# Patient Record
Sex: Male | Born: 1935 | Race: White | Hispanic: No | Marital: Single | Smoking: Never smoker
Health system: Southern US, Community
[De-identification: ages and names within clinical notes are randomized; demographics above are authoritative.]

## PROBLEM LIST (undated history)

## (undated) DIAGNOSIS — E785 Hyperlipidemia, unspecified: Secondary | ICD-10-CM

## (undated) DIAGNOSIS — I10 Essential (primary) hypertension: Secondary | ICD-10-CM

## (undated) DIAGNOSIS — C61 Malignant neoplasm of prostate: Secondary | ICD-10-CM

## (undated) HISTORY — PX: TONSILECTOMY, ADENOIDECTOMY, BILATERAL MYRINGOTOMY AND TUBES: SHX2538

## (undated) HISTORY — PX: HERNIA REPAIR: SHX51

## (undated) HISTORY — DX: Essential (primary) hypertension: I10

## (undated) HISTORY — DX: Malignant neoplasm of prostate: C61

## (undated) HISTORY — DX: Hyperlipidemia, unspecified: E78.5

---

## 2004-10-17 ENCOUNTER — Ambulatory Visit: Payer: Self-pay | Admitting: Radiation Oncology

## 2004-10-24 ENCOUNTER — Ambulatory Visit: Payer: Self-pay | Admitting: Radiation Oncology

## 2005-04-17 ENCOUNTER — Ambulatory Visit: Payer: Self-pay | Admitting: Radiation Oncology

## 2005-04-23 ENCOUNTER — Ambulatory Visit: Payer: Self-pay | Admitting: Radiation Oncology

## 2006-03-18 ENCOUNTER — Ambulatory Visit: Payer: Self-pay | Admitting: Gastroenterology

## 2006-04-03 ENCOUNTER — Ambulatory Visit: Payer: Self-pay | Admitting: Urology

## 2006-04-24 ENCOUNTER — Ambulatory Visit: Payer: Self-pay | Admitting: Radiation Oncology

## 2006-05-24 ENCOUNTER — Ambulatory Visit: Payer: Self-pay | Admitting: Radiation Oncology

## 2007-03-24 ENCOUNTER — Ambulatory Visit: Payer: Self-pay | Admitting: Radiation Oncology

## 2007-04-23 ENCOUNTER — Ambulatory Visit: Payer: Self-pay | Admitting: Radiation Oncology

## 2007-04-24 ENCOUNTER — Ambulatory Visit: Payer: Self-pay | Admitting: Radiation Oncology

## 2007-09-24 ENCOUNTER — Ambulatory Visit: Payer: Self-pay | Admitting: Radiation Oncology

## 2007-10-25 ENCOUNTER — Ambulatory Visit: Payer: Self-pay | Admitting: Radiation Oncology

## 2008-02-23 ENCOUNTER — Ambulatory Visit: Payer: Self-pay | Admitting: Internal Medicine

## 2008-02-24 ENCOUNTER — Ambulatory Visit: Payer: Self-pay | Admitting: Internal Medicine

## 2008-03-23 ENCOUNTER — Ambulatory Visit: Payer: Self-pay | Admitting: Radiation Oncology

## 2008-04-07 ENCOUNTER — Ambulatory Visit: Payer: Self-pay | Admitting: Radiation Oncology

## 2008-04-23 ENCOUNTER — Ambulatory Visit: Payer: Self-pay | Admitting: Radiation Oncology

## 2008-06-29 ENCOUNTER — Other Ambulatory Visit: Payer: Self-pay

## 2008-06-29 ENCOUNTER — Ambulatory Visit: Payer: Self-pay | Admitting: Surgery

## 2008-07-06 ENCOUNTER — Ambulatory Visit: Payer: Self-pay | Admitting: Surgery

## 2008-08-30 ENCOUNTER — Ambulatory Visit: Payer: Self-pay | Admitting: Internal Medicine

## 2009-02-27 ENCOUNTER — Ambulatory Visit: Payer: Self-pay | Admitting: Internal Medicine

## 2009-07-18 ENCOUNTER — Ambulatory Visit: Payer: Self-pay | Admitting: Unknown Physician Specialty

## 2010-10-17 ENCOUNTER — Ambulatory Visit: Payer: Self-pay | Admitting: Urology

## 2013-10-01 ENCOUNTER — Emergency Department: Payer: Self-pay | Admitting: Emergency Medicine

## 2013-10-02 ENCOUNTER — Emergency Department: Payer: Self-pay | Admitting: Emergency Medicine

## 2013-10-02 LAB — CBC WITH DIFFERENTIAL/PLATELET
BASOS PCT: 0.2 %
Basophil #: 0 10*3/uL (ref 0.0–0.1)
EOS PCT: 0.1 %
Eosinophil #: 0 10*3/uL (ref 0.0–0.7)
HCT: 34 % — ABNORMAL LOW (ref 40.0–52.0)
HGB: 11.4 g/dL — AB (ref 13.0–18.0)
Lymphocyte #: 0.9 10*3/uL — ABNORMAL LOW (ref 1.0–3.6)
Lymphocyte %: 8.4 %
MCH: 33.3 pg (ref 26.0–34.0)
MCHC: 33.4 g/dL (ref 32.0–36.0)
MCV: 100 fL (ref 80–100)
Monocyte #: 1.3 x10 3/mm — ABNORMAL HIGH (ref 0.2–1.0)
Monocyte %: 11.4 %
NEUTROS ABS: 9.1 10*3/uL — AB (ref 1.4–6.5)
NEUTROS PCT: 79.9 %
Platelet: 170 10*3/uL (ref 150–440)
RBC: 3.41 10*6/uL — ABNORMAL LOW (ref 4.40–5.90)
RDW: 13.8 % (ref 11.5–14.5)
WBC: 11.3 10*3/uL — ABNORMAL HIGH (ref 3.8–10.6)

## 2013-10-02 LAB — BASIC METABOLIC PANEL
ANION GAP: 6 — AB (ref 7–16)
BUN: 35 mg/dL — ABNORMAL HIGH (ref 7–18)
CO2: 28 mmol/L (ref 21–32)
Calcium, Total: 9.1 mg/dL (ref 8.5–10.1)
Chloride: 105 mmol/L (ref 98–107)
Creatinine: 1.66 mg/dL — ABNORMAL HIGH (ref 0.60–1.30)
EGFR (Non-African Amer.): 38 — ABNORMAL LOW
GFR CALC AF AMER: 44 — AB
Glucose: 174 mg/dL — ABNORMAL HIGH (ref 65–99)
OSMOLALITY: 290 (ref 275–301)
POTASSIUM: 4 mmol/L (ref 3.5–5.1)
Sodium: 139 mmol/L (ref 136–145)

## 2013-10-02 LAB — URINALYSIS, COMPLETE
BILIRUBIN, UR: NEGATIVE
BLOOD: NEGATIVE
Glucose,UR: 50 mg/dL (ref 0–75)
Hyaline Cast: 59
LEUKOCYTE ESTERASE: NEGATIVE
Nitrite: NEGATIVE
Ph: 5 (ref 4.5–8.0)
Protein: 30
RBC,UR: <1 /HPF (ref 0–5)
Specific Gravity: 1.023 (ref 1.003–1.030)
Squamous Epithelial: 1
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
## 2014-09-26 DIAGNOSIS — D649 Anemia, unspecified: Secondary | ICD-10-CM | POA: Diagnosis not present

## 2014-09-26 DIAGNOSIS — I10 Essential (primary) hypertension: Secondary | ICD-10-CM | POA: Diagnosis not present

## 2014-09-26 DIAGNOSIS — E785 Hyperlipidemia, unspecified: Secondary | ICD-10-CM | POA: Diagnosis not present

## 2014-09-26 DIAGNOSIS — D72819 Decreased white blood cell count, unspecified: Secondary | ICD-10-CM | POA: Diagnosis not present

## 2014-09-29 DIAGNOSIS — B0230 Zoster ocular disease, unspecified: Secondary | ICD-10-CM | POA: Diagnosis not present

## 2014-10-13 DIAGNOSIS — B0230 Zoster ocular disease, unspecified: Secondary | ICD-10-CM | POA: Diagnosis not present

## 2014-10-27 DIAGNOSIS — B0230 Zoster ocular disease, unspecified: Secondary | ICD-10-CM | POA: Diagnosis not present

## 2014-11-08 DIAGNOSIS — S022XXA Fracture of nasal bones, initial encounter for closed fracture: Secondary | ICD-10-CM | POA: Diagnosis not present

## 2014-11-08 DIAGNOSIS — S0992XA Unspecified injury of nose, initial encounter: Secondary | ICD-10-CM | POA: Diagnosis not present

## 2014-11-08 DIAGNOSIS — Y92099 Unspecified place in other non-institutional residence as the place of occurrence of the external cause: Secondary | ICD-10-CM | POA: Diagnosis not present

## 2014-11-08 DIAGNOSIS — T148 Other injury of unspecified body region: Secondary | ICD-10-CM | POA: Diagnosis not present

## 2014-11-08 DIAGNOSIS — W19XXXA Unspecified fall, initial encounter: Secondary | ICD-10-CM | POA: Diagnosis not present

## 2014-11-10 DIAGNOSIS — B0230 Zoster ocular disease, unspecified: Secondary | ICD-10-CM | POA: Diagnosis not present

## 2014-12-08 DIAGNOSIS — I4891 Unspecified atrial fibrillation: Secondary | ICD-10-CM | POA: Insufficient documentation

## 2014-12-09 DIAGNOSIS — B0230 Zoster ocular disease, unspecified: Secondary | ICD-10-CM | POA: Diagnosis not present

## 2014-12-12 DIAGNOSIS — R351 Nocturia: Secondary | ICD-10-CM | POA: Diagnosis not present

## 2014-12-12 DIAGNOSIS — R350 Frequency of micturition: Secondary | ICD-10-CM | POA: Diagnosis not present

## 2014-12-12 DIAGNOSIS — Z8546 Personal history of malignant neoplasm of prostate: Secondary | ICD-10-CM | POA: Diagnosis not present

## 2014-12-12 DIAGNOSIS — N401 Enlarged prostate with lower urinary tract symptoms: Secondary | ICD-10-CM | POA: Diagnosis not present

## 2015-01-06 DIAGNOSIS — B0230 Zoster ocular disease, unspecified: Secondary | ICD-10-CM | POA: Diagnosis not present

## 2015-02-03 DIAGNOSIS — B0230 Zoster ocular disease, unspecified: Secondary | ICD-10-CM | POA: Diagnosis not present

## 2015-02-17 DIAGNOSIS — B0230 Zoster ocular disease, unspecified: Secondary | ICD-10-CM | POA: Diagnosis not present

## 2015-03-07 DIAGNOSIS — B0232 Zoster iridocyclitis: Secondary | ICD-10-CM | POA: Diagnosis not present

## 2015-03-28 DIAGNOSIS — I10 Essential (primary) hypertension: Secondary | ICD-10-CM | POA: Diagnosis not present

## 2015-03-28 DIAGNOSIS — D649 Anemia, unspecified: Secondary | ICD-10-CM | POA: Diagnosis not present

## 2015-03-28 DIAGNOSIS — E785 Hyperlipidemia, unspecified: Secondary | ICD-10-CM | POA: Diagnosis not present

## 2015-03-28 DIAGNOSIS — I480 Paroxysmal atrial fibrillation: Secondary | ICD-10-CM | POA: Diagnosis not present

## 2015-04-04 DIAGNOSIS — D649 Anemia, unspecified: Secondary | ICD-10-CM | POA: Diagnosis not present

## 2015-04-04 DIAGNOSIS — E784 Other hyperlipidemia: Secondary | ICD-10-CM | POA: Diagnosis not present

## 2015-04-04 DIAGNOSIS — I10 Essential (primary) hypertension: Secondary | ICD-10-CM | POA: Diagnosis not present

## 2015-04-04 DIAGNOSIS — I480 Paroxysmal atrial fibrillation: Secondary | ICD-10-CM | POA: Diagnosis not present

## 2015-04-13 DIAGNOSIS — D649 Anemia, unspecified: Secondary | ICD-10-CM | POA: Diagnosis not present

## 2015-05-04 DIAGNOSIS — I480 Paroxysmal atrial fibrillation: Secondary | ICD-10-CM | POA: Diagnosis not present

## 2015-05-04 DIAGNOSIS — E784 Other hyperlipidemia: Secondary | ICD-10-CM | POA: Diagnosis not present

## 2015-05-04 DIAGNOSIS — I10 Essential (primary) hypertension: Secondary | ICD-10-CM | POA: Diagnosis not present

## 2015-05-04 DIAGNOSIS — D649 Anemia, unspecified: Secondary | ICD-10-CM | POA: Diagnosis not present

## 2015-05-17 DIAGNOSIS — B0232 Zoster iridocyclitis: Secondary | ICD-10-CM | POA: Diagnosis not present

## 2015-09-28 DIAGNOSIS — I10 Essential (primary) hypertension: Secondary | ICD-10-CM | POA: Diagnosis not present

## 2015-09-28 DIAGNOSIS — D649 Anemia, unspecified: Secondary | ICD-10-CM | POA: Diagnosis not present

## 2015-10-05 DIAGNOSIS — D72818 Other decreased white blood cell count: Secondary | ICD-10-CM | POA: Diagnosis not present

## 2015-10-05 DIAGNOSIS — I480 Paroxysmal atrial fibrillation: Secondary | ICD-10-CM | POA: Diagnosis not present

## 2015-10-05 DIAGNOSIS — E784 Other hyperlipidemia: Secondary | ICD-10-CM | POA: Diagnosis not present

## 2015-10-05 DIAGNOSIS — D6489 Other specified anemias: Secondary | ICD-10-CM | POA: Diagnosis not present

## 2015-10-05 DIAGNOSIS — I10 Essential (primary) hypertension: Secondary | ICD-10-CM | POA: Diagnosis not present

## 2015-11-14 DIAGNOSIS — H2513 Age-related nuclear cataract, bilateral: Secondary | ICD-10-CM | POA: Diagnosis not present

## 2015-12-12 DIAGNOSIS — Z8546 Personal history of malignant neoplasm of prostate: Secondary | ICD-10-CM | POA: Diagnosis not present

## 2015-12-12 DIAGNOSIS — N401 Enlarged prostate with lower urinary tract symptoms: Secondary | ICD-10-CM | POA: Diagnosis not present

## 2016-03-29 DIAGNOSIS — I10 Essential (primary) hypertension: Secondary | ICD-10-CM | POA: Diagnosis not present

## 2016-03-29 DIAGNOSIS — D6489 Other specified anemias: Secondary | ICD-10-CM | POA: Diagnosis not present

## 2016-03-29 DIAGNOSIS — I480 Paroxysmal atrial fibrillation: Secondary | ICD-10-CM | POA: Diagnosis not present

## 2016-04-05 DIAGNOSIS — H3530 Unspecified macular degeneration: Secondary | ICD-10-CM | POA: Diagnosis not present

## 2016-04-05 DIAGNOSIS — D72818 Other decreased white blood cell count: Secondary | ICD-10-CM | POA: Diagnosis not present

## 2016-04-05 DIAGNOSIS — Z8546 Personal history of malignant neoplasm of prostate: Secondary | ICD-10-CM | POA: Diagnosis not present

## 2016-04-05 DIAGNOSIS — E784 Other hyperlipidemia: Secondary | ICD-10-CM | POA: Diagnosis not present

## 2016-04-05 DIAGNOSIS — I480 Paroxysmal atrial fibrillation: Secondary | ICD-10-CM | POA: Diagnosis not present

## 2016-04-05 DIAGNOSIS — D6489 Other specified anemias: Secondary | ICD-10-CM | POA: Diagnosis not present

## 2016-04-05 DIAGNOSIS — I10 Essential (primary) hypertension: Secondary | ICD-10-CM | POA: Diagnosis not present

## 2016-10-02 DIAGNOSIS — D6489 Other specified anemias: Secondary | ICD-10-CM | POA: Diagnosis not present

## 2016-10-02 DIAGNOSIS — I10 Essential (primary) hypertension: Secondary | ICD-10-CM | POA: Diagnosis not present

## 2016-10-02 DIAGNOSIS — I480 Paroxysmal atrial fibrillation: Secondary | ICD-10-CM | POA: Diagnosis not present

## 2016-11-13 DIAGNOSIS — I10 Essential (primary) hypertension: Secondary | ICD-10-CM | POA: Diagnosis not present

## 2016-11-13 DIAGNOSIS — D72818 Other decreased white blood cell count: Secondary | ICD-10-CM | POA: Diagnosis not present

## 2016-11-13 DIAGNOSIS — I480 Paroxysmal atrial fibrillation: Secondary | ICD-10-CM | POA: Diagnosis not present

## 2016-11-13 DIAGNOSIS — E784 Other hyperlipidemia: Secondary | ICD-10-CM | POA: Diagnosis not present

## 2016-11-13 DIAGNOSIS — Z8546 Personal history of malignant neoplasm of prostate: Secondary | ICD-10-CM | POA: Diagnosis not present

## 2016-11-13 DIAGNOSIS — R251 Tremor, unspecified: Secondary | ICD-10-CM | POA: Diagnosis not present

## 2016-11-13 DIAGNOSIS — D649 Anemia, unspecified: Secondary | ICD-10-CM | POA: Diagnosis not present

## 2016-12-17 DIAGNOSIS — Z8546 Personal history of malignant neoplasm of prostate: Secondary | ICD-10-CM | POA: Diagnosis not present

## 2016-12-18 DIAGNOSIS — Z8546 Personal history of malignant neoplasm of prostate: Secondary | ICD-10-CM | POA: Diagnosis not present

## 2017-05-05 DIAGNOSIS — I480 Paroxysmal atrial fibrillation: Secondary | ICD-10-CM | POA: Diagnosis not present

## 2017-05-05 DIAGNOSIS — D649 Anemia, unspecified: Secondary | ICD-10-CM | POA: Diagnosis not present

## 2017-05-05 DIAGNOSIS — I10 Essential (primary) hypertension: Secondary | ICD-10-CM | POA: Diagnosis not present

## 2017-05-12 DIAGNOSIS — D649 Anemia, unspecified: Secondary | ICD-10-CM | POA: Diagnosis not present

## 2017-05-12 DIAGNOSIS — E784 Other hyperlipidemia: Secondary | ICD-10-CM | POA: Diagnosis not present

## 2017-05-12 DIAGNOSIS — D72818 Other decreased white blood cell count: Secondary | ICD-10-CM | POA: Diagnosis not present

## 2017-05-12 DIAGNOSIS — I10 Essential (primary) hypertension: Secondary | ICD-10-CM | POA: Diagnosis not present

## 2017-05-12 DIAGNOSIS — Z0000 Encounter for general adult medical examination without abnormal findings: Secondary | ICD-10-CM | POA: Diagnosis not present

## 2017-05-12 DIAGNOSIS — I480 Paroxysmal atrial fibrillation: Secondary | ICD-10-CM | POA: Diagnosis not present

## 2017-05-12 DIAGNOSIS — Z8546 Personal history of malignant neoplasm of prostate: Secondary | ICD-10-CM | POA: Diagnosis not present

## 2017-11-07 DIAGNOSIS — E7849 Other hyperlipidemia: Secondary | ICD-10-CM | POA: Diagnosis not present

## 2017-11-07 DIAGNOSIS — I10 Essential (primary) hypertension: Secondary | ICD-10-CM | POA: Diagnosis not present

## 2017-11-07 DIAGNOSIS — I480 Paroxysmal atrial fibrillation: Secondary | ICD-10-CM | POA: Diagnosis not present

## 2017-11-07 DIAGNOSIS — D649 Anemia, unspecified: Secondary | ICD-10-CM | POA: Diagnosis not present

## 2017-11-17 DIAGNOSIS — R251 Tremor, unspecified: Secondary | ICD-10-CM | POA: Diagnosis not present

## 2017-11-17 DIAGNOSIS — I10 Essential (primary) hypertension: Secondary | ICD-10-CM | POA: Diagnosis not present

## 2017-11-17 DIAGNOSIS — E7849 Other hyperlipidemia: Secondary | ICD-10-CM | POA: Diagnosis not present

## 2017-11-17 DIAGNOSIS — D692 Other nonthrombocytopenic purpura: Secondary | ICD-10-CM | POA: Diagnosis not present

## 2017-11-17 DIAGNOSIS — D72818 Other decreased white blood cell count: Secondary | ICD-10-CM | POA: Diagnosis not present

## 2017-11-17 DIAGNOSIS — I480 Paroxysmal atrial fibrillation: Secondary | ICD-10-CM | POA: Diagnosis not present

## 2017-11-17 DIAGNOSIS — D649 Anemia, unspecified: Secondary | ICD-10-CM | POA: Diagnosis not present

## 2017-11-17 DIAGNOSIS — R011 Cardiac murmur, unspecified: Secondary | ICD-10-CM | POA: Diagnosis not present

## 2017-11-17 DIAGNOSIS — Z8546 Personal history of malignant neoplasm of prostate: Secondary | ICD-10-CM | POA: Diagnosis not present

## 2017-11-21 DIAGNOSIS — R011 Cardiac murmur, unspecified: Secondary | ICD-10-CM | POA: Diagnosis not present

## 2017-11-24 DIAGNOSIS — I340 Nonrheumatic mitral (valve) insufficiency: Secondary | ICD-10-CM | POA: Insufficient documentation

## 2017-11-25 ENCOUNTER — Ambulatory Visit: Payer: Medicare HMO | Admitting: Urology

## 2017-11-25 ENCOUNTER — Encounter: Payer: Self-pay | Admitting: Urology

## 2017-11-25 VITALS — BP 134/71 | HR 70 | Ht 73.0 in | Wt 156.3 lb

## 2017-11-25 DIAGNOSIS — R351 Nocturia: Secondary | ICD-10-CM | POA: Diagnosis not present

## 2017-11-25 DIAGNOSIS — Z8546 Personal history of malignant neoplasm of prostate: Secondary | ICD-10-CM

## 2017-11-25 DIAGNOSIS — N138 Other obstructive and reflux uropathy: Secondary | ICD-10-CM

## 2017-11-25 DIAGNOSIS — N401 Enlarged prostate with lower urinary tract symptoms: Secondary | ICD-10-CM

## 2017-11-25 MED ORDER — OXYBUTYNIN CHLORIDE 5 MG PO TABS: 5.0000 mg | ORAL_TABLET | Freq: Every day | ORAL | 0 refills | Status: DC

## 2017-11-25 NOTE — Progress Notes (Signed)
11/25/2017 2:52 PM   Darryl Sanders August 03, 1929 [id number]   Chief Complaint  Patient presents with  . Prostate Cancer    HPI: 84 year old male previously followed by me in [place] and at [facility] for prostate cancer.  I last saw him at [facility] in March 2018.  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  He underwent cystoscopy for a UTI and gross hematuria in 2007 and was noted to have significant prostate enlargement.  He was started on finasteride at that time which he has continued.  Since his visit last year he has noted increase urinary frequency and urgency.  His most bothersome symptom is waking up at 4 in the morning and having to void every 20-30 minutes for the next hour with only small volumes obtained.  He denies recurrent UTI or gross hematuria.  He denies flank, abdominal, pelvic or scrotal pain.  Last PSA August 2018 was 0.1.   PMH: Past Medical History:  Diagnosis Date  . Hyperlipidemia   . Hypertension   . Prostate cancer [facility])     Surgical History: Past Surgical History:  Procedure Laterality Date  . HERNIA REPAIR    . TONSILECTOMY, ADENOIDECTOMY, BILATERAL MYRINGOTOMY AND TUBES      Home Medications:  Allergies as of 11/25/2017   No Known Allergies     Medication List        Accurate as of 11/25/17  2:52 PM. Always use your most recent med list.          aspirin EC 81 MG tablet Take by mouth.   Biotin 2.5 MG Tabs   Calcium Carb-Cholecalciferol 600-800 MG-UNIT Tabs Take by mouth.   finasteride 5 MG tablet Commonly known as:  PROSCAR   lisinopril 10 MG tablet Commonly known as:  PRINIVIL,ZESTRIL Take 5 mg by mouth.   lovastatin 20 MG tablet Commonly known as:  MEVACOR TAKE 1 TABLET EVERY NIGHT FOR CHOLESTEROL   LUTEIN PO Take by mouth.       Allergies: No Known Allergies  Family History: Family History  Problem Relation Age of Onset  . Prostate cancer Father    . Prostate cancer Maternal Uncle   . Prostate cancer Paternal Uncle   . Bladder Cancer Neg Hx   . Kidney cancer Neg Hx     Social History:  reports that  has never smoked. he has never used smokeless tobacco. He reports that he drinks alcohol. He reports that he does not use drugs.  ROS: UROLOGY Frequent Urination?: Yes Hard to postpone urination?: No Burning/pain with urination?: No Get up at night to urinate?: Yes Leakage of urine?: No Urine stream starts and stops?: No Trouble starting stream?: No Do you have to strain to urinate?: No Blood in urine?: No Urinary tract infection?: No Sexually transmitted disease?: No Injury to kidneys or bladder?: No Painful intercourse?: No Weak stream?: No Erection problems?: No Penile pain?: No  Gastrointestinal Nausea?: No Vomiting?: No Indigestion/heartburn?: No Diarrhea?: No Constipation?: No  Constitutional Fever: No Night sweats?: No Weight loss?: No Fatigue?: No  Skin Skin rash/lesions?: No Itching?: No  Eyes Blurred vision?: No Double vision?: No  Ears/Nose/Throat Sore throat?: No Sinus problems?: No  Hematologic/Lymphatic Swollen glands?: No Easy bruising?: No  Cardiovascular Leg swelling?: No Chest pain?: No  Respiratory Cough?: No Shortness of breath?: No  Endocrine Excessive thirst?: No  Musculoskeletal Back pain?: No Joint pain?: No  Neurological Headaches?: No Dizziness?:  No  Psychologic Depression?: No Anxiety?: No  Physical Exam: BP 134/71 (BP Location: Right Arm, Patient Position: Sitting, Cuff Size: Normal)   Pulse 70   Ht 6\' 1"  (1.854 m)   Wt 156 lb 4.8 oz (70.9 kg)   BMI 20.62 kg/m   Constitutional:  Alert and oriented, No acute distress. HEENT: [place] AT, moist mucus membranes.  Trachea midline, no masses. Cardiovascular: No clubbing, cyanosis, or edema. Respiratory: Normal respiratory effort, no increased work of breathing. GI: Abdomen is soft, nontender, nondistended,  no abdominal masses GU: No CVA tenderness.  Skin: No rashes, bruises or suspicious lesions. Lymph: No cervical or inguinal adenopathy. Neurologic: Grossly intact, no focal deficits, moving all 4 extremities. Psychiatric: Normal mood and affect.  Laboratory Data: Lab Results  Component Value Date   WBC 11.3 (H) 10/02/2013   HGB 11.4 (L) 10/02/2013   HCT 34.0 (L) 10/02/2013   MCV 100 10/02/2013   PLT 170 10/02/2013    Lab Results  Component Value Date   CREATININE 1.66 (H) 10/02/2013     Assessment & Plan:   84 year old male with history of prostate cancer.  He desires to continue PSA testing.  He did not need a refill on finasteride.  He was interested in at least a trial of medical management of his nighttime voiding symptoms and Rx oxybutynin 5 mg nightly was sent to his pharmacy.  Continue annual follow-up.    Abbie Sons, [place] [address] [place], [place] [address] [phone]

## 2017-11-26 ENCOUNTER — Encounter: Payer: Self-pay | Admitting: Urology

## 2017-11-26 DIAGNOSIS — N138 Other obstructive and reflux uropathy: Secondary | ICD-10-CM | POA: Insufficient documentation

## 2017-11-26 DIAGNOSIS — N401 Enlarged prostate with lower urinary tract symptoms: Secondary | ICD-10-CM

## 2017-11-26 DIAGNOSIS — R351 Nocturia: Secondary | ICD-10-CM | POA: Insufficient documentation

## 2017-11-26 DIAGNOSIS — Z8546 Personal history of malignant neoplasm of prostate: Secondary | ICD-10-CM | POA: Insufficient documentation

## 2017-12-23 ENCOUNTER — Other Ambulatory Visit: Payer: Self-pay

## 2017-12-23 DIAGNOSIS — I480 Paroxysmal atrial fibrillation: Secondary | ICD-10-CM | POA: Diagnosis not present

## 2017-12-23 DIAGNOSIS — I340 Nonrheumatic mitral (valve) insufficiency: Secondary | ICD-10-CM | POA: Diagnosis not present

## 2017-12-23 DIAGNOSIS — E7849 Other hyperlipidemia: Secondary | ICD-10-CM | POA: Diagnosis not present

## 2017-12-23 DIAGNOSIS — I10 Essential (primary) hypertension: Secondary | ICD-10-CM | POA: Diagnosis not present

## 2017-12-23 MED ORDER — FINASTERIDE 5 MG PO TABS: 5.0000 mg | ORAL_TABLET | Freq: Every day | ORAL | 6 refills | Status: DC

## 2017-12-30 DIAGNOSIS — I491 Atrial premature depolarization: Secondary | ICD-10-CM | POA: Diagnosis not present

## 2018-05-05 DIAGNOSIS — I480 Paroxysmal atrial fibrillation: Secondary | ICD-10-CM | POA: Diagnosis not present

## 2018-05-05 DIAGNOSIS — I340 Nonrheumatic mitral (valve) insufficiency: Secondary | ICD-10-CM | POA: Diagnosis not present

## 2018-05-05 DIAGNOSIS — E7849 Other hyperlipidemia: Secondary | ICD-10-CM | POA: Diagnosis not present

## 2018-05-05 DIAGNOSIS — I10 Essential (primary) hypertension: Secondary | ICD-10-CM | POA: Diagnosis not present

## 2018-05-12 DIAGNOSIS — E7849 Other hyperlipidemia: Secondary | ICD-10-CM | POA: Diagnosis not present

## 2018-05-12 DIAGNOSIS — I10 Essential (primary) hypertension: Secondary | ICD-10-CM | POA: Diagnosis not present

## 2018-05-12 DIAGNOSIS — D72818 Other decreased white blood cell count: Secondary | ICD-10-CM | POA: Diagnosis not present

## 2018-05-12 DIAGNOSIS — D649 Anemia, unspecified: Secondary | ICD-10-CM | POA: Diagnosis not present

## 2018-05-19 DIAGNOSIS — D649 Anemia, unspecified: Secondary | ICD-10-CM | POA: Diagnosis not present

## 2018-05-19 DIAGNOSIS — Z0000 Encounter for general adult medical examination without abnormal findings: Secondary | ICD-10-CM | POA: Diagnosis not present

## 2018-05-19 DIAGNOSIS — E7849 Other hyperlipidemia: Secondary | ICD-10-CM | POA: Diagnosis not present

## 2018-05-19 DIAGNOSIS — I10 Essential (primary) hypertension: Secondary | ICD-10-CM | POA: Diagnosis not present

## 2018-05-19 DIAGNOSIS — D72818 Other decreased white blood cell count: Secondary | ICD-10-CM | POA: Diagnosis not present

## 2018-05-19 DIAGNOSIS — Z8546 Personal history of malignant neoplasm of prostate: Secondary | ICD-10-CM | POA: Diagnosis not present

## 2018-05-19 DIAGNOSIS — I340 Nonrheumatic mitral (valve) insufficiency: Secondary | ICD-10-CM | POA: Diagnosis not present

## 2018-05-19 DIAGNOSIS — I480 Paroxysmal atrial fibrillation: Secondary | ICD-10-CM | POA: Diagnosis not present

## 2018-05-19 DIAGNOSIS — D692 Other nonthrombocytopenic purpura: Secondary | ICD-10-CM | POA: Diagnosis not present

## 2018-06-30 ENCOUNTER — Other Ambulatory Visit: Payer: Self-pay | Admitting: Family Medicine

## 2018-06-30 MED ORDER — FINASTERIDE 5 MG PO TABS: 5.0000 mg | ORAL_TABLET | Freq: Every day | ORAL | 11 refills | Status: DC

## 2018-10-22 DIAGNOSIS — H2513 Age-related nuclear cataract, bilateral: Secondary | ICD-10-CM | POA: Diagnosis not present

## 2018-11-09 DIAGNOSIS — I10 Essential (primary) hypertension: Secondary | ICD-10-CM | POA: Diagnosis not present

## 2018-11-09 DIAGNOSIS — I480 Paroxysmal atrial fibrillation: Secondary | ICD-10-CM | POA: Diagnosis not present

## 2018-11-09 DIAGNOSIS — E7849 Other hyperlipidemia: Secondary | ICD-10-CM | POA: Diagnosis not present

## 2018-11-09 DIAGNOSIS — I340 Nonrheumatic mitral (valve) insufficiency: Secondary | ICD-10-CM | POA: Diagnosis not present

## 2018-11-09 DIAGNOSIS — I350 Nonrheumatic aortic (valve) stenosis: Secondary | ICD-10-CM | POA: Diagnosis not present

## 2018-11-10 DIAGNOSIS — E7849 Other hyperlipidemia: Secondary | ICD-10-CM | POA: Diagnosis not present

## 2018-11-10 DIAGNOSIS — D649 Anemia, unspecified: Secondary | ICD-10-CM | POA: Diagnosis not present

## 2018-11-10 DIAGNOSIS — Z8546 Personal history of malignant neoplasm of prostate: Secondary | ICD-10-CM | POA: Diagnosis not present

## 2018-11-10 DIAGNOSIS — I10 Essential (primary) hypertension: Secondary | ICD-10-CM | POA: Diagnosis not present

## 2018-11-17 DIAGNOSIS — E7849 Other hyperlipidemia: Secondary | ICD-10-CM | POA: Diagnosis not present

## 2018-11-17 DIAGNOSIS — D72818 Other decreased white blood cell count: Secondary | ICD-10-CM | POA: Diagnosis not present

## 2018-11-17 DIAGNOSIS — I340 Nonrheumatic mitral (valve) insufficiency: Secondary | ICD-10-CM | POA: Diagnosis not present

## 2018-11-17 DIAGNOSIS — D692 Other nonthrombocytopenic purpura: Secondary | ICD-10-CM | POA: Diagnosis not present

## 2018-11-17 DIAGNOSIS — D649 Anemia, unspecified: Secondary | ICD-10-CM | POA: Diagnosis not present

## 2018-11-17 DIAGNOSIS — I350 Nonrheumatic aortic (valve) stenosis: Secondary | ICD-10-CM | POA: Diagnosis not present

## 2018-11-17 DIAGNOSIS — I480 Paroxysmal atrial fibrillation: Secondary | ICD-10-CM | POA: Diagnosis not present

## 2018-11-17 DIAGNOSIS — I10 Essential (primary) hypertension: Secondary | ICD-10-CM | POA: Diagnosis not present

## 2018-11-24 DIAGNOSIS — I340 Nonrheumatic mitral (valve) insufficiency: Secondary | ICD-10-CM | POA: Diagnosis not present

## 2018-11-24 DIAGNOSIS — I350 Nonrheumatic aortic (valve) stenosis: Secondary | ICD-10-CM | POA: Diagnosis not present

## 2018-11-24 DIAGNOSIS — I480 Paroxysmal atrial fibrillation: Secondary | ICD-10-CM | POA: Diagnosis not present

## 2018-11-25 ENCOUNTER — Ambulatory Visit: Payer: Medicare HMO | Admitting: Urology

## 2018-11-27 ENCOUNTER — Ambulatory Visit: Payer: Medicare HMO | Admitting: Urology

## 2018-11-27 ENCOUNTER — Encounter: Payer: Self-pay | Admitting: Urology

## 2018-11-27 VITALS — BP 152/77 | HR 74 | Ht 73.0 in | Wt 148.0 lb

## 2018-11-27 DIAGNOSIS — D72819 Decreased white blood cell count, unspecified: Secondary | ICD-10-CM | POA: Insufficient documentation

## 2018-11-27 DIAGNOSIS — D649 Anemia, unspecified: Secondary | ICD-10-CM | POA: Insufficient documentation

## 2018-11-27 DIAGNOSIS — H3530 Unspecified macular degeneration: Secondary | ICD-10-CM | POA: Insufficient documentation

## 2018-11-27 DIAGNOSIS — E785 Hyperlipidemia, unspecified: Secondary | ICD-10-CM | POA: Insufficient documentation

## 2018-11-27 DIAGNOSIS — R350 Frequency of micturition: Secondary | ICD-10-CM | POA: Diagnosis not present

## 2018-11-27 DIAGNOSIS — Z8546 Personal history of malignant neoplasm of prostate: Secondary | ICD-10-CM

## 2018-11-27 DIAGNOSIS — R251 Tremor, unspecified: Secondary | ICD-10-CM | POA: Insufficient documentation

## 2018-11-27 DIAGNOSIS — I10 Essential (primary) hypertension: Secondary | ICD-10-CM | POA: Insufficient documentation

## 2018-11-27 MED ORDER — FINASTERIDE 5 MG PO TABS: 5.0000 mg | ORAL_TABLET | Freq: Every day | ORAL | 3 refills | Status: DC

## 2018-11-27 NOTE — Progress Notes (Signed)
11/27/2018 1:55 PM   Darryl Sanders 1929-03-14 [id number]  Referring provider: Adin Hector, MD [facility] [place], [place] [address]  Chief Complaint  Patient presents with  . Prostate Cancer   Urologic history: 1.  History of prostate cancer  -T1c low risk adenocarcinoma prostate 2005 treated with IMRT  2.  History gross hematuria  -UTI gross hematuria 2007; cystoscopy with significant prostate enlargement.  No upper tract abnormalities.  3.  BPH with lower urinary tract symptoms  -On finasteride   HPI: 85 year old male presents for annual follow-up.  At his visit last year he had elected to discontinue PSA testing.  He states Dr. [name] did check it last month and it remained low at 0.08.  At his visit last year he was given a trial of immediate release oxybutynin to take at bedtime for nocturia however had side effects with this medication and discontinued.  He states his nocturia is actually much better and has decreased from 3 times per night to 1/occasionally twice per night.  Denies dysuria, gross hematuria or flank/abdominal/pelvic/scrotal pain.  He remains on finasteride.   PMH: Past Medical History:  Diagnosis Date  . Hyperlipidemia   . Hypertension   . Prostate cancer [facility])     Surgical History: Past Surgical History:  Procedure Laterality Date  . HERNIA REPAIR    . TONSILECTOMY, ADENOIDECTOMY, BILATERAL MYRINGOTOMY AND TUBES      Home Medications:  Allergies as of 11/27/2018   No Known Allergies     Medication List       Accurate as of November 27, 2018  1:55 PM. Always use your most recent med list.        aspirin EC 81 MG tablet Take by mouth.   beta carotene w/minerals tablet Take by mouth.   Biotin 2.5 MG Tabs   CALCIUM 600 PO Take by mouth daily.   Calcium Carb-Cholecalciferol 600-800 MG-UNIT Tabs Take by mouth.   finasteride 5 MG tablet Commonly known as:  PROSCAR Take 1 tablet (5 mg total) by  mouth daily.   lisinopril 10 MG tablet Commonly known as:  PRINIVIL,ZESTRIL Take 5 mg by mouth.   lovastatin 20 MG tablet Commonly known as:  MEVACOR TAKE 1 TABLET EVERY NIGHT FOR CHOLESTEROL   LUTEIN PO Take by mouth.       Allergies: No Known Allergies  Family History: Family History  Problem Relation Age of Onset  . Prostate cancer Father   . Prostate cancer Maternal Uncle   . Prostate cancer Paternal Uncle   . Bladder Cancer Neg Hx   . Kidney cancer Neg Hx     Social History:  reports that he has never smoked. He has never used smokeless tobacco. He reports current alcohol use. He reports that he does not use drugs.  ROS: UROLOGY Frequent Urination?: Yes Hard to postpone urination?: Yes Burning/pain with urination?: No Get up at night to urinate?: Yes Leakage of urine?: No Urine stream starts and stops?: No Trouble starting stream?: No Do you have to strain to urinate?: No Blood in urine?: No Urinary tract infection?: No Sexually transmitted disease?: No Injury to kidneys or bladder?: No Painful intercourse?: No Weak stream?: No Erection problems?: No Penile pain?: No  Gastrointestinal Nausea?: No Vomiting?: No Indigestion/heartburn?: No Diarrhea?: No Constipation?: No  Constitutional Fever: No Night sweats?: No Weight loss?: No Fatigue?: No  Skin Skin rash/lesions?: No Itching?: No  Eyes Blurred vision?: No Double vision?: No  Ears/Nose/Throat Sore throat?: No Sinus problems?: No  Hematologic/Lymphatic Swollen glands?: No Easy bruising?: No  Cardiovascular Leg swelling?: No Chest pain?: No  Respiratory Cough?: No Shortness of breath?: No  Endocrine Excessive thirst?: No  Musculoskeletal Back pain?: No Joint pain?: No  Neurological Headaches?: No Dizziness?: No  Psychologic Depression?: No Anxiety?: No  Physical Exam: BP (!) 152/77 (BP Location: Left Arm, Patient Position: Sitting, Cuff Size: Normal)   Pulse 74    Ht 6\' 1"  (1.854 m)   Wt 148 lb (67.1 kg)   BMI 19.53 kg/m   Constitutional:  Alert and oriented, No acute distress. HEENT: [place] AT, moist mucus membranes.  Trachea midline, no masses. Cardiovascular: No clubbing, cyanosis, or edema. Respiratory: Normal respiratory effort, no increased work of breathing. Neurologic: Grossly intact, no focal deficits, moving all 4 extremities. Psychiatric: Normal mood and affect.   Assessment & Plan:   85 year old male with a history of prostate cancer status post radiation.  He was noted on cystoscopy to have significant prostatic enlargement on evaluation 2 years after completing radiation and was started on finasteride.  This medication was refilled.  He will continue annual follow-up.   Abbie Sons, [place] [address] [place], [place] [address] [phone]

## 2019-05-11 DIAGNOSIS — I340 Nonrheumatic mitral (valve) insufficiency: Secondary | ICD-10-CM | POA: Diagnosis not present

## 2019-05-11 DIAGNOSIS — R55 Syncope and collapse: Secondary | ICD-10-CM | POA: Diagnosis not present

## 2019-05-11 DIAGNOSIS — E7849 Other hyperlipidemia: Secondary | ICD-10-CM | POA: Diagnosis not present

## 2019-05-11 DIAGNOSIS — I499 Cardiac arrhythmia, unspecified: Secondary | ICD-10-CM | POA: Diagnosis not present

## 2019-05-11 DIAGNOSIS — I480 Paroxysmal atrial fibrillation: Secondary | ICD-10-CM | POA: Diagnosis not present

## 2019-05-12 DIAGNOSIS — D649 Anemia, unspecified: Secondary | ICD-10-CM | POA: Diagnosis not present

## 2019-05-12 DIAGNOSIS — I10 Essential (primary) hypertension: Secondary | ICD-10-CM | POA: Diagnosis not present

## 2019-05-12 DIAGNOSIS — E7849 Other hyperlipidemia: Secondary | ICD-10-CM | POA: Diagnosis not present

## 2019-05-25 DIAGNOSIS — R251 Tremor, unspecified: Secondary | ICD-10-CM | POA: Diagnosis not present

## 2019-05-25 DIAGNOSIS — D72818 Other decreased white blood cell count: Secondary | ICD-10-CM | POA: Diagnosis not present

## 2019-05-25 DIAGNOSIS — D692 Other nonthrombocytopenic purpura: Secondary | ICD-10-CM | POA: Diagnosis not present

## 2019-05-25 DIAGNOSIS — Z8546 Personal history of malignant neoplasm of prostate: Secondary | ICD-10-CM | POA: Diagnosis not present

## 2019-05-25 DIAGNOSIS — I10 Essential (primary) hypertension: Secondary | ICD-10-CM | POA: Diagnosis not present

## 2019-05-25 DIAGNOSIS — Z0000 Encounter for general adult medical examination without abnormal findings: Secondary | ICD-10-CM | POA: Diagnosis not present

## 2019-05-25 DIAGNOSIS — I480 Paroxysmal atrial fibrillation: Secondary | ICD-10-CM | POA: Diagnosis not present

## 2019-05-25 DIAGNOSIS — I350 Nonrheumatic aortic (valve) stenosis: Secondary | ICD-10-CM | POA: Diagnosis not present

## 2019-05-25 DIAGNOSIS — I340 Nonrheumatic mitral (valve) insufficiency: Secondary | ICD-10-CM | POA: Diagnosis not present

## 2019-06-17 DIAGNOSIS — D649 Anemia, unspecified: Secondary | ICD-10-CM | POA: Diagnosis not present

## 2019-06-24 DIAGNOSIS — D649 Anemia, unspecified: Secondary | ICD-10-CM | POA: Diagnosis not present

## 2019-10-11 ENCOUNTER — Other Ambulatory Visit: Payer: Self-pay | Admitting: Urology

## 2019-11-16 DIAGNOSIS — D649 Anemia, unspecified: Secondary | ICD-10-CM | POA: Diagnosis not present

## 2019-11-16 DIAGNOSIS — E7849 Other hyperlipidemia: Secondary | ICD-10-CM | POA: Diagnosis not present

## 2019-11-23 DIAGNOSIS — Q676 Pectus excavatum: Secondary | ICD-10-CM | POA: Diagnosis not present

## 2019-11-23 DIAGNOSIS — I480 Paroxysmal atrial fibrillation: Secondary | ICD-10-CM | POA: Diagnosis not present

## 2019-11-23 DIAGNOSIS — R600 Localized edema: Secondary | ICD-10-CM | POA: Diagnosis not present

## 2019-11-23 DIAGNOSIS — E7849 Other hyperlipidemia: Secondary | ICD-10-CM | POA: Diagnosis not present

## 2019-11-23 DIAGNOSIS — R251 Tremor, unspecified: Secondary | ICD-10-CM | POA: Diagnosis not present

## 2019-11-23 DIAGNOSIS — D649 Anemia, unspecified: Secondary | ICD-10-CM | POA: Diagnosis not present

## 2019-11-23 DIAGNOSIS — I10 Essential (primary) hypertension: Secondary | ICD-10-CM | POA: Diagnosis not present

## 2019-11-23 DIAGNOSIS — Z8546 Personal history of malignant neoplasm of prostate: Secondary | ICD-10-CM | POA: Diagnosis not present

## 2019-11-23 DIAGNOSIS — D692 Other nonthrombocytopenic purpura: Secondary | ICD-10-CM | POA: Diagnosis not present

## 2019-11-29 ENCOUNTER — Encounter: Payer: Self-pay | Admitting: Urology

## 2019-11-29 ENCOUNTER — Ambulatory Visit (INDEPENDENT_AMBULATORY_CARE_PROVIDER_SITE_OTHER): Payer: Medicare HMO | Admitting: Urology

## 2019-11-29 ENCOUNTER — Other Ambulatory Visit: Payer: Self-pay

## 2019-11-29 VITALS — BP 128/74 | HR 72 | Ht 73.0 in | Wt 151.0 lb

## 2019-11-29 DIAGNOSIS — N138 Other obstructive and reflux uropathy: Secondary | ICD-10-CM

## 2019-11-29 DIAGNOSIS — Z8546 Personal history of malignant neoplasm of prostate: Secondary | ICD-10-CM | POA: Diagnosis not present

## 2019-11-29 DIAGNOSIS — R351 Nocturia: Secondary | ICD-10-CM | POA: Diagnosis not present

## 2019-11-29 DIAGNOSIS — N401 Enlarged prostate with lower urinary tract symptoms: Secondary | ICD-10-CM | POA: Diagnosis not present

## 2019-11-29 NOTE — Progress Notes (Signed)
   11/29/2019 3:17 PM   Darryl Sanders 1929/08/31 [id number]  Referring provider: Adin Hector, MD [place] [facility] [place],  [place] [address]  Chief Complaint  Patient presents with  . Follow-up    Urologic history: 1.  History of prostate cancer -T1c low risk adenocarcinoma prostate 2005 treated with IMRT  2.  History gross hematuria  -UTI gross hematuria 2007; cystoscopy with significant prostate enlargement.  No upper tract abnormalities.  3.  BPH with lower urinary tract symptoms  -On finasteride   HPI: 86 y.o. male presents for annual follow-up.    -Remains on finasteride -Gets up around 0445 to void and will have to void 2-3 additional times over the next 2 hours -Denies dysuria, gross hematuria -No flank, abdominal or pelvic pain -UA with Dr. [name] last month unremarkable -Elected to discontinue PSA testing 2020   PMH: Past Medical History:  Diagnosis Date  . Hyperlipidemia   . Hypertension   . Prostate cancer [facility])     Surgical History: Past Surgical History:  Procedure Laterality Date  . HERNIA REPAIR    . TONSILECTOMY, ADENOIDECTOMY, BILATERAL MYRINGOTOMY AND TUBES      Home Medications:  Allergies as of 11/29/2019   No Known Allergies     Medication List       Accurate as of November 29, 2019  3:17 PM. If you have any questions, ask your nurse or doctor.        aspirin EC 81 MG tablet Take by mouth.   beta carotene w/minerals tablet Take by mouth.   Biotin 2.5 MG Tabs   CALCIUM 600 PO Take by mouth daily.   finasteride 5 MG tablet Commonly known as: PROSCAR TAKE 1 TABLET BY MOUTH ONCE A DAY   lisinopril 10 MG tablet Commonly known as: ZESTRIL Take 5 mg by mouth.   lovastatin 20 MG tablet Commonly known as: MEVACOR TAKE 1 TABLET EVERY NIGHT FOR CHOLESTEROL   LUTEIN PO Take by mouth.       Allergies: No Known Allergies  Family History: Family History  Problem Relation Age of Onset  .  Prostate cancer Father   . Prostate cancer Maternal Uncle   . Prostate cancer Paternal Uncle   . Bladder Cancer Neg Hx   . Kidney cancer Neg Hx     Social History:  reports that he has never smoked. He has never used smokeless tobacco. He reports current alcohol use. He reports that he does not use drugs.   Physical Exam: BP 128/74   Pulse 72   Ht 6\' 1"  (1.854 m)   Wt 151 lb (68.5 kg)   BMI 19.92 kg/m   Constitutional:  Alert and oriented, No acute distress. HEENT: [place] AT, moist mucus membranes.  Trachea midline, no masses. Cardiovascular: No clubbing, cyanosis, or edema. Respiratory: Normal respiratory effort, no increased work of breathing. Skin: No rashes, bruises or suspicious lesions. Neurologic: Grossly intact, no focal deficits, moving all 4 extremities. Psychiatric: Normal mood and affect.   Assessment & Plan:    - BPH with lower urinary tract symptoms Increased nocturia over the last 12 months.  Finasteride was refilled.  - Nocturia Trial of immediate release trospium 1 hour prior to bedtime.   Abbie Sons, [place] [address] [place], [place] [address] [phone]

## 2019-12-01 ENCOUNTER — Encounter: Payer: Self-pay | Admitting: Urology

## 2019-12-01 MED ORDER — TROSPIUM CHLORIDE 20 MG PO TABS: 20.0000 mg | ORAL_TABLET | Freq: Two times a day (BID) | ORAL | 0 refills | Status: AC

## 2019-12-01 MED ORDER — FINASTERIDE 5 MG PO TABS: 5.0000 mg | ORAL_TABLET | Freq: Every day | ORAL | 3 refills | Status: DC

## 2020-02-23 ENCOUNTER — Other Ambulatory Visit: Payer: Self-pay | Admitting: *Deleted

## 2020-02-23 MED ORDER — FINASTERIDE 5 MG PO TABS: 5.0000 mg | ORAL_TABLET | Freq: Every day | ORAL | 3 refills | Status: DC

## 2020-02-23 NOTE — Telephone Encounter (Signed)
Patient send letter to use [facility]

## 2020-05-18 DIAGNOSIS — I10 Essential (primary) hypertension: Secondary | ICD-10-CM | POA: Diagnosis not present

## 2020-05-18 DIAGNOSIS — D649 Anemia, unspecified: Secondary | ICD-10-CM | POA: Diagnosis not present

## 2020-05-18 DIAGNOSIS — E7849 Other hyperlipidemia: Secondary | ICD-10-CM | POA: Diagnosis not present

## 2020-07-31 DIAGNOSIS — Z0000 Encounter for general adult medical examination without abnormal findings: Secondary | ICD-10-CM | POA: Diagnosis not present

## 2020-07-31 DIAGNOSIS — E785 Hyperlipidemia, unspecified: Secondary | ICD-10-CM | POA: Diagnosis not present

## 2020-07-31 DIAGNOSIS — R310 Gross hematuria: Secondary | ICD-10-CM | POA: Diagnosis not present

## 2020-07-31 DIAGNOSIS — I10 Essential (primary) hypertension: Secondary | ICD-10-CM | POA: Diagnosis not present

## 2020-07-31 DIAGNOSIS — D649 Anemia, unspecified: Secondary | ICD-10-CM | POA: Diagnosis not present

## 2020-07-31 DIAGNOSIS — I4891 Unspecified atrial fibrillation: Secondary | ICD-10-CM | POA: Diagnosis not present

## 2020-11-29 ENCOUNTER — Ambulatory Visit: Payer: Self-pay | Admitting: Urology

## 2021-01-22 DIAGNOSIS — I10 Essential (primary) hypertension: Secondary | ICD-10-CM | POA: Diagnosis not present

## 2021-01-22 DIAGNOSIS — E7849 Other hyperlipidemia: Secondary | ICD-10-CM | POA: Diagnosis not present

## 2021-01-22 DIAGNOSIS — D649 Anemia, unspecified: Secondary | ICD-10-CM | POA: Diagnosis not present

## 2021-01-30 DIAGNOSIS — D649 Anemia, unspecified: Secondary | ICD-10-CM | POA: Diagnosis not present

## 2021-01-30 DIAGNOSIS — I10 Essential (primary) hypertension: Secondary | ICD-10-CM | POA: Diagnosis not present

## 2021-01-30 DIAGNOSIS — I480 Paroxysmal atrial fibrillation: Secondary | ICD-10-CM | POA: Diagnosis not present

## 2021-01-30 DIAGNOSIS — E785 Hyperlipidemia, unspecified: Secondary | ICD-10-CM | POA: Diagnosis not present

## 2021-01-30 DIAGNOSIS — D692 Other nonthrombocytopenic purpura: Secondary | ICD-10-CM | POA: Diagnosis not present

## 2021-02-05 ENCOUNTER — Ambulatory Visit: Payer: Medicare HMO | Admitting: Urology

## 2021-02-05 ENCOUNTER — Encounter: Payer: Self-pay | Admitting: Urology

## 2021-02-05 ENCOUNTER — Other Ambulatory Visit: Payer: Self-pay

## 2021-02-05 VITALS — BP 174/79 | HR 90 | Ht 73.0 in | Wt 147.0 lb

## 2021-02-05 DIAGNOSIS — R310 Gross hematuria: Secondary | ICD-10-CM

## 2021-02-05 DIAGNOSIS — R351 Nocturia: Secondary | ICD-10-CM | POA: Diagnosis not present

## 2021-02-05 DIAGNOSIS — N401 Enlarged prostate with lower urinary tract symptoms: Secondary | ICD-10-CM | POA: Diagnosis not present

## 2021-02-05 DIAGNOSIS — N138 Other obstructive and reflux uropathy: Secondary | ICD-10-CM

## 2021-02-05 DIAGNOSIS — Z8546 Personal history of malignant neoplasm of prostate: Secondary | ICD-10-CM | POA: Diagnosis not present

## 2021-02-05 LAB — BLADDER SCAN AMB NON-IMAGING: Scan Result: 53

## 2021-02-05 NOTE — Progress Notes (Signed)
02/05/2021 1:51 PM   Darryl Sanders Jun 26, 1929 [id number]  Referring provider: Adin Hector, MD [place] [facility] [place],  [place] [address]  Chief Complaint  Patient presents with  . Prostate Cancer    Urologic history: 1.History of prostate cancer -T1c low risk adenocarcinoma prostate 2005 treated with IMRT  2.History gross hematuria -UTI gross hematuria 2007; cystoscopy with significant prostate enlargement. No upper tract abnormalities.  3.BPH with lower urinary tract symptoms -On finasteride  HPI: 87 y.o. male presents for annual follow-up.   At last years visit he was complaining of increased nocturia and I recommended an initial trial of immediate release trospium 1 hour prior to bedtime however this was not covered by his insurance company and he elected not to pursue any alternatives  He typically voids at night and to a plastic jug in the dark and states in November 2021 he noted his urine was darker red.  He had intermittent episodes of initial hematuria in November but no further episodes since that time  Denies dysuria  Denies flank, abdominal or pelvic pain  Remains on finasteride  Recent urinalysis Dr. Olin Pia office was negative   PMH: Past Medical History:  Diagnosis Date  . Hyperlipidemia   . Hypertension   . Prostate cancer [facility])     Surgical History: Past Surgical History:  Procedure Laterality Date  . HERNIA REPAIR    . TONSILECTOMY, ADENOIDECTOMY, BILATERAL MYRINGOTOMY AND TUBES      Home Medications:  Allergies as of 02/05/2021   No Known Allergies     Medication List       Accurate as of Feb 05, 2021  1:51 PM. If you have any questions, ask your nurse or doctor.        aspirin EC 81 MG tablet Take by mouth.   beta carotene w/minerals tablet Take by mouth.   Biotin 2.5 MG Tabs   CALCIUM 600 PO Take by mouth daily.   finasteride 5 MG tablet Commonly known as: PROSCAR Take 1  tablet (5 mg total) by mouth daily.   lisinopril 10 MG tablet Commonly known as: ZESTRIL Take 5 mg by mouth.   lovastatin 20 MG tablet Commonly known as: MEVACOR TAKE 1 TABLET EVERY NIGHT FOR CHOLESTEROL   LUTEIN PO Take by mouth.   trospium 20 MG tablet Commonly known as: SANCTURA Take 1 tablet (20 mg total) by mouth 2 (two) times daily.       Allergies: No Known Allergies  Family History: Family History  Problem Relation Age of Onset  . Prostate cancer Father   . Prostate cancer Maternal Uncle   . Prostate cancer Paternal Uncle   . Bladder Cancer Neg Hx   . Kidney cancer Neg Hx     Social History:  reports that he has never smoked. He has never used smokeless tobacco. He reports current alcohol use. He reports that he does not use drugs.   Physical Exam: BP (!) 174/79   Pulse 90   Ht 6\' 1"  (1.854 m)   Wt 147 lb (66.7 kg)   BMI 19.39 kg/m   Constitutional:  Alert and oriented, No acute distress. HEENT: [place] AT, moist mucus membranes.  Trachea midline, no masses. Cardiovascular: No clubbing, cyanosis, or edema. Respiratory: Normal respiratory effort, no increased work of breathing. Skin: No rashes, bruises or suspicious lesions. Neurologic: Grossly intact, no focal deficits, moving all 4 extremities. Psychiatric: Normal mood and affect.   Assessment & Plan:  1.  Gross hematuria  Episodes of intermittent initial stream hematuria November 2021  Since this most likely is lower tract etiology and based on contrast shortage will schedule a renal ultrasound for upper tract evaluation  Cystoscopy for lower tract evaluation  2.  Nocturia  Stable  3.  History prostate cancer  Status post IMRT 2005    Darryl Sanders, [place] [address] [place], [place] [address] [phone]

## 2021-02-28 ENCOUNTER — Other Ambulatory Visit: Payer: Self-pay | Admitting: Urology

## 2021-03-19 ENCOUNTER — Ambulatory Visit: Payer: Medicare HMO | Admitting: Urology

## 2021-03-19 ENCOUNTER — Encounter: Payer: Self-pay | Admitting: Urology

## 2021-03-19 ENCOUNTER — Other Ambulatory Visit: Payer: Self-pay

## 2021-03-19 VITALS — BP 144/77 | HR 91 | Ht 73.0 in | Wt 147.0 lb

## 2021-03-19 DIAGNOSIS — R310 Gross hematuria: Secondary | ICD-10-CM

## 2021-03-19 NOTE — Progress Notes (Signed)
   03/19/21  CC:  Chief Complaint  Patient presents with   Cysto    HPI: Refer to my prior note 02/05/2021.  He denies recurrent gross hematuria  Blood pressure (!) 144/77, pulse 91, height 6\' 1"  (1.854 m), weight 147 lb (66.7 kg). NED. A&Ox3.   No respiratory distress   Abd soft, NT, ND Normal phallus with bilateral descended testicles  Cystoscopy Procedure Note  Patient identification was confirmed, informed consent was obtained, and patient was prepped using Betadine solution.  Lidocaine jelly was administered per urethral meatus.     Pre-Procedure: - Inspection reveals a normal caliber urethral meatus.  Procedure: The flexible cystoscope was introduced without difficulty - No urethral strictures/lesions are present. -  Moderate lateral lobe enlargement with hypervascularity  prostate  - Elevated bladder neck, mild - Bilateral ureteral orifices identified - Bladder mucosa  reveals no ulcers, tumors, or lesions - No bladder stones -Trabeculated bladder with cellules  Retroflexion shows small amount of intravesical prostate tissue   Post-Procedure: - Patient tolerated the procedure well  Assessment/ Plan: Hematuria most likely prostatic in origin No bladder mucosal abnormalities noted Urine cytology sent Unlikely upper tract however a renal ultrasound was ordered but has not yet been scheduled   Abbie Sons, MD

## 2021-03-20 LAB — MICROSCOPIC EXAMINATION: Bacteria, UA: NONE SEEN

## 2021-03-20 LAB — URINALYSIS, COMPLETE
Bilirubin, UA: NEGATIVE
Glucose, UA: NEGATIVE
Leukocytes,UA: NEGATIVE
Nitrite, UA: NEGATIVE
RBC, UA: NEGATIVE
Specific Gravity, UA: 1.025 (ref 1.005–1.030)
Urobilinogen, Ur: 0.2 mg/dL (ref 0.2–1.0)
pH, UA: 5 (ref 5.0–7.5)

## 2021-03-21 LAB — CYTOLOGY - NON PAP

## 2021-03-22 ENCOUNTER — Telehealth: Payer: Self-pay | Admitting: *Deleted

## 2021-03-22 NOTE — Telephone Encounter (Signed)
Notified patient as instructed, patient pleased °

## 2021-03-22 NOTE — Telephone Encounter (Signed)
-----   Message from Abbie Sons, MD sent at 03/22/2021  1:40 PM EDT ----- Urine cytology showed no abnormal appearing cells

## 2021-04-05 ENCOUNTER — Ambulatory Visit
Admission: RE | Admit: 2021-04-05 | Discharge: 2021-04-05 | Disposition: A | Discharge status: Home / Self Care | Payer: Medicare HMO | Source: Ambulatory Visit | Attending: Urology | Admitting: Urology

## 2021-04-05 ENCOUNTER — Other Ambulatory Visit: Payer: Self-pay

## 2021-04-05 DIAGNOSIS — R319 Hematuria, unspecified: Secondary | ICD-10-CM | POA: Diagnosis not present

## 2021-04-05 DIAGNOSIS — R310 Gross hematuria: Secondary | ICD-10-CM

## 2021-04-05 DIAGNOSIS — N200 Calculus of kidney: Secondary | ICD-10-CM | POA: Diagnosis not present

## 2021-04-05 DIAGNOSIS — N281 Cyst of kidney, acquired: Secondary | ICD-10-CM | POA: Diagnosis not present

## 2021-04-09 ENCOUNTER — Telehealth: Payer: Self-pay | Admitting: *Deleted

## 2021-04-09 NOTE — Telephone Encounter (Signed)
Notified patient as instructed.   

## 2021-04-09 NOTE — Telephone Encounter (Signed)
-----   Message from Abbie Sons, MD sent at 04/09/2021  3:42 PM EDT ----- Renal ultrasound showed a nonobstructing left renal calculus and a left renal cyst neither of which would have been a cause of his prior episode of gross hematuria

## 2021-08-06 DIAGNOSIS — I10 Essential (primary) hypertension: Secondary | ICD-10-CM | POA: Diagnosis not present

## 2021-08-06 DIAGNOSIS — D649 Anemia, unspecified: Secondary | ICD-10-CM | POA: Diagnosis not present

## 2021-08-06 DIAGNOSIS — E7849 Other hyperlipidemia: Secondary | ICD-10-CM | POA: Diagnosis not present

## 2021-08-13 DIAGNOSIS — E785 Hyperlipidemia, unspecified: Secondary | ICD-10-CM | POA: Diagnosis not present

## 2021-08-13 DIAGNOSIS — I10 Essential (primary) hypertension: Secondary | ICD-10-CM | POA: Diagnosis not present

## 2021-08-13 DIAGNOSIS — D649 Anemia, unspecified: Secondary | ICD-10-CM | POA: Diagnosis not present

## 2021-08-13 DIAGNOSIS — H9193 Unspecified hearing loss, bilateral: Secondary | ICD-10-CM | POA: Diagnosis not present

## 2021-08-13 DIAGNOSIS — Z1389 Encounter for screening for other disorder: Secondary | ICD-10-CM | POA: Diagnosis not present

## 2021-08-13 DIAGNOSIS — I38 Endocarditis, valve unspecified: Secondary | ICD-10-CM | POA: Diagnosis not present

## 2021-08-13 DIAGNOSIS — I480 Paroxysmal atrial fibrillation: Secondary | ICD-10-CM | POA: Diagnosis not present

## 2021-08-13 DIAGNOSIS — Z8546 Personal history of malignant neoplasm of prostate: Secondary | ICD-10-CM | POA: Diagnosis not present

## 2021-08-13 DIAGNOSIS — Z0000 Encounter for general adult medical examination without abnormal findings: Secondary | ICD-10-CM | POA: Diagnosis not present

## 2021-09-10 DIAGNOSIS — D649 Anemia, unspecified: Secondary | ICD-10-CM | POA: Diagnosis not present

## 2021-09-27 DIAGNOSIS — I38 Endocarditis, valve unspecified: Secondary | ICD-10-CM | POA: Diagnosis not present

## 2021-09-27 DIAGNOSIS — I480 Paroxysmal atrial fibrillation: Secondary | ICD-10-CM | POA: Diagnosis not present

## 2021-10-18 DIAGNOSIS — I340 Nonrheumatic mitral (valve) insufficiency: Secondary | ICD-10-CM | POA: Diagnosis not present

## 2021-10-18 DIAGNOSIS — E7849 Other hyperlipidemia: Secondary | ICD-10-CM | POA: Diagnosis not present

## 2021-10-18 DIAGNOSIS — I10 Essential (primary) hypertension: Secondary | ICD-10-CM | POA: Diagnosis not present

## 2021-10-18 DIAGNOSIS — R55 Syncope and collapse: Secondary | ICD-10-CM | POA: Diagnosis not present

## 2021-10-18 DIAGNOSIS — I350 Nonrheumatic aortic (valve) stenosis: Secondary | ICD-10-CM | POA: Diagnosis not present

## 2022-02-04 DIAGNOSIS — E7849 Other hyperlipidemia: Secondary | ICD-10-CM | POA: Diagnosis not present

## 2022-02-04 DIAGNOSIS — I10 Essential (primary) hypertension: Secondary | ICD-10-CM | POA: Diagnosis not present

## 2022-02-04 DIAGNOSIS — D649 Anemia, unspecified: Secondary | ICD-10-CM | POA: Diagnosis not present

## 2022-02-04 DIAGNOSIS — I480 Paroxysmal atrial fibrillation: Secondary | ICD-10-CM | POA: Diagnosis not present

## 2022-02-11 DIAGNOSIS — D649 Anemia, unspecified: Secondary | ICD-10-CM | POA: Diagnosis not present

## 2022-02-11 DIAGNOSIS — I38 Endocarditis, valve unspecified: Secondary | ICD-10-CM | POA: Diagnosis not present

## 2022-02-11 DIAGNOSIS — E785 Hyperlipidemia, unspecified: Secondary | ICD-10-CM | POA: Diagnosis not present

## 2022-02-11 DIAGNOSIS — I4891 Unspecified atrial fibrillation: Secondary | ICD-10-CM | POA: Diagnosis not present

## 2022-02-11 DIAGNOSIS — I10 Essential (primary) hypertension: Secondary | ICD-10-CM | POA: Diagnosis not present

## 2022-02-20 ENCOUNTER — Other Ambulatory Visit: Payer: Self-pay | Admitting: Urology

## 2022-02-20 DIAGNOSIS — R0609 Other forms of dyspnea: Secondary | ICD-10-CM | POA: Diagnosis not present

## 2022-02-20 DIAGNOSIS — R079 Chest pain, unspecified: Secondary | ICD-10-CM | POA: Diagnosis not present

## 2022-02-20 DIAGNOSIS — I480 Paroxysmal atrial fibrillation: Secondary | ICD-10-CM | POA: Diagnosis not present

## 2022-02-20 DIAGNOSIS — E7849 Other hyperlipidemia: Secondary | ICD-10-CM | POA: Diagnosis not present

## 2022-02-20 DIAGNOSIS — D649 Anemia, unspecified: Secondary | ICD-10-CM | POA: Diagnosis not present

## 2022-02-20 DIAGNOSIS — I340 Nonrheumatic mitral (valve) insufficiency: Secondary | ICD-10-CM | POA: Diagnosis not present

## 2022-02-20 DIAGNOSIS — I350 Nonrheumatic aortic (valve) stenosis: Secondary | ICD-10-CM | POA: Diagnosis not present

## 2022-02-25 ENCOUNTER — Ambulatory Visit: Payer: Medicare HMO | Admitting: Urology

## 2022-02-27 ENCOUNTER — Encounter: Payer: Self-pay | Admitting: Urology

## 2022-02-27 ENCOUNTER — Ambulatory Visit: Payer: Medicare HMO | Admitting: Urology

## 2022-02-27 VITALS — BP 130/74 | HR 72 | Ht 73.0 in | Wt 150.0 lb

## 2022-02-27 DIAGNOSIS — N200 Calculus of kidney: Secondary | ICD-10-CM

## 2022-02-27 DIAGNOSIS — R351 Nocturia: Secondary | ICD-10-CM | POA: Diagnosis not present

## 2022-02-27 DIAGNOSIS — R310 Gross hematuria: Secondary | ICD-10-CM

## 2022-02-27 DIAGNOSIS — N401 Enlarged prostate with lower urinary tract symptoms: Secondary | ICD-10-CM | POA: Diagnosis not present

## 2022-02-27 DIAGNOSIS — Z8546 Personal history of malignant neoplasm of prostate: Secondary | ICD-10-CM | POA: Diagnosis not present

## 2022-02-27 DIAGNOSIS — Z87898 Personal history of other specified conditions: Secondary | ICD-10-CM

## 2022-02-27 LAB — URINALYSIS, COMPLETE
Bilirubin, UA: NEGATIVE
Glucose, UA: NEGATIVE
Ketones, UA: NEGATIVE
Leukocytes,UA: NEGATIVE
Nitrite, UA: NEGATIVE
RBC, UA: NEGATIVE
Specific Gravity, UA: 1.02 (ref 1.005–1.030)
Urobilinogen, Ur: 0.2 mg/dL (ref 0.2–1.0)
pH, UA: 5.5 (ref 5.0–7.5)

## 2022-02-27 LAB — MICROSCOPIC EXAMINATION
Bacteria, UA: NONE SEEN
RBC, Urine: NONE SEEN /hpf (ref 0–2)

## 2022-02-27 NOTE — Progress Notes (Signed)
02/27/2022 3:00 PM   Darryl Sanders 08-12-1929 [id number]  Referring provider: Adin Hector, MD [facility] [place],  [place] [address]  Chief Complaint  Patient presents with   Hematuria    Urologic history: 1.  History of prostate cancer -T1c low risk adenocarcinoma prostate 2005 treated with IMRT   2.  History gross hematuria  -UTI gross hematuria 2007; cystoscopy with significant prostate enlargement.  No upper tract abnormalities. -Repeat cystoscopy June 2022; no significant changes from prior cystoscopy; RUS showed a 7 mm nonobstructing renal calculus and 4.9 cm parapelvic cyst left kidney   3.  BPH with lower urinary tract symptoms  -On finasteride  HPI: 88 y.o. male presents for annual follow-up.  Doing well since last visit Stable LUTS on finasteride/trospium Denies dysuria, gross hematuria Denies flank, abdominal or pelvic pain UAs with Dr. [name] x2 have been negative for blood   PMH: Past Medical History:  Diagnosis Date   Hyperlipidemia    Hypertension    Prostate cancer [facility])     Surgical History: Past Surgical History:  Procedure Laterality Date   HERNIA REPAIR     TONSILECTOMY, ADENOIDECTOMY, BILATERAL MYRINGOTOMY AND TUBES      Home Medications:  Allergies as of 02/27/2022   No Known Allergies      Medication List        Accurate as of February 27, 2022  3:00 PM. If you have any questions, ask your nurse or doctor.          STOP taking these medications    aspirin EC 81 MG tablet Stopped by: Abbie Sons, MD       TAKE these medications    beta carotene w/minerals tablet Take by mouth.   Biotin 2.5 MG Tabs   CALCIUM 600 PO Take by mouth daily.   finasteride 5 MG tablet Commonly known as: PROSCAR TAKE 1 TABLET BY MOUTH ONCE A DAY   lisinopril 10 MG tablet Commonly known as: ZESTRIL Take 5 mg by mouth.   lovastatin 20 MG tablet Commonly known as: MEVACOR TAKE 1 TABLET EVERY NIGHT  FOR CHOLESTEROL   LUTEIN PO Take by mouth.   trospium 20 MG tablet Commonly known as: SANCTURA Take 1 tablet (20 mg total) by mouth 2 (two) times daily.        Allergies: No Known Allergies  Family History: Family History  Problem Relation Age of Onset   Prostate cancer Father    Prostate cancer Maternal Uncle    Prostate cancer Paternal Uncle    Bladder Cancer Neg Hx    Kidney cancer Neg Hx     Social History:  reports that he has never smoked. He has never used smokeless tobacco. He reports current alcohol use. He reports that he does not use drugs.   Physical Exam: BP 130/74   Pulse 72   Ht '6\' 1"'$  (1.854 m)   Wt 150 lb (68 kg)   BMI 19.79 kg/m   Constitutional:  Alert and oriented, No acute distress. HEENT: [place] AT, moist mucus membranes.  Trachea midline, no masses. Cardiovascular: No clubbing, cyanosis, or edema. Respiratory: Normal respiratory effort, no increased work of breathing. Skin: No rashes, bruises or suspicious lesions. Neurologic: Grossly intact, no focal deficits, moving all 4 extremities. Psychiatric: Normal mood and affect.   Assessment & Plan:    1.  History gross hematuria No recurrent episodes last 12 months  2.  Nocturia Stable  3.  History  prostate cancer Status post IMRT 2005  4.  Nephrolithiasis Nonobstructing renal calculus on ultrasound 1 year follow-up with [facility]  5.  BPH with LUTS Stable on finasteride    Abbie Sons, MD  [facility] [address] [place], [place] [address] [phone]

## 2022-03-01 ENCOUNTER — Encounter: Payer: Self-pay | Admitting: Urology

## 2022-03-12 IMAGING — US US RENAL
1 series · 14 of 25 positions shown · non-contrast
Comparison: CT scan October 02, 2013

CLINICAL DATA: Gross hematuria

EXAM:
RENAL / URINARY TRACT ULTRASOUND COMPLETE

[Series 1: us renal · 0.23mm/px · 14 of 40 slices shown]
[im 1/40]
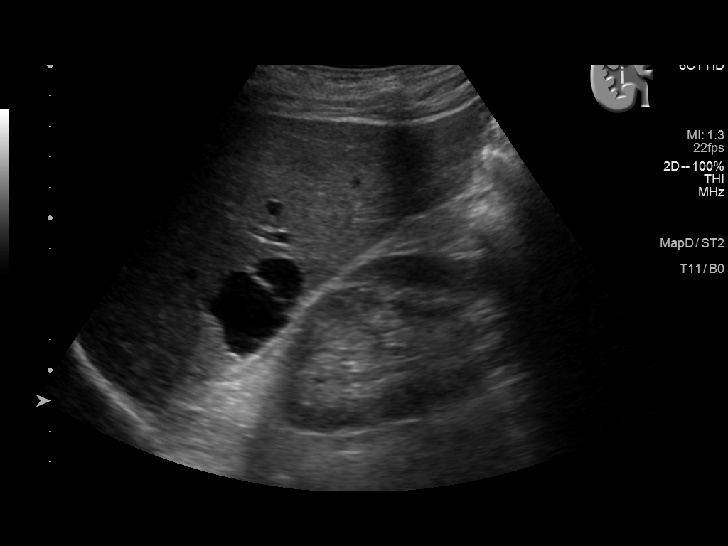
[im 4/40]
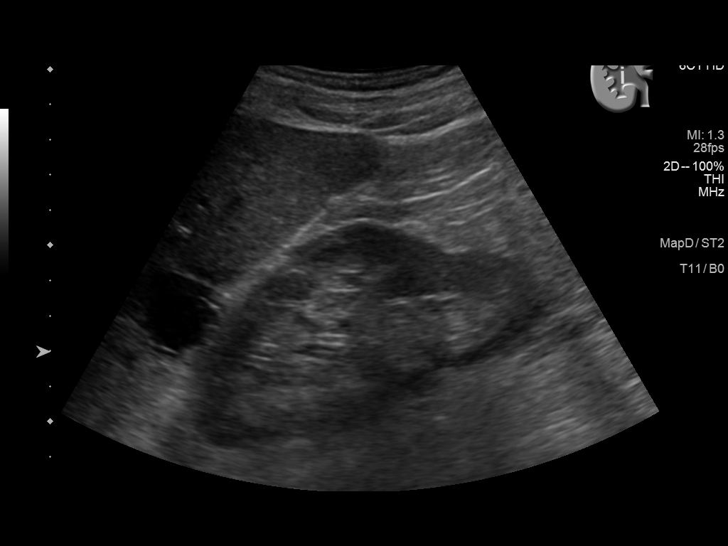
[im 7/40]
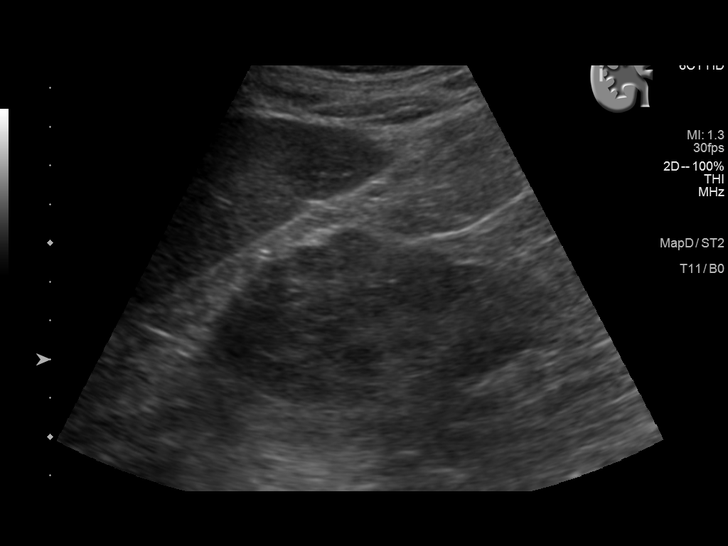
[im 10/40]
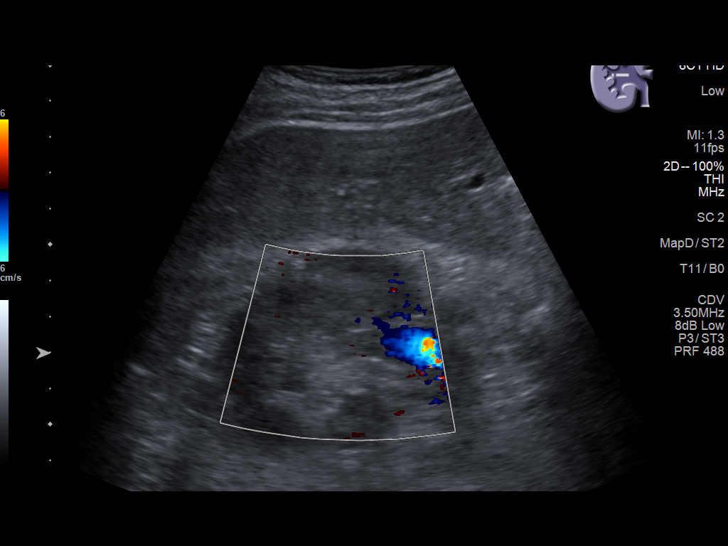
[im 14/40]
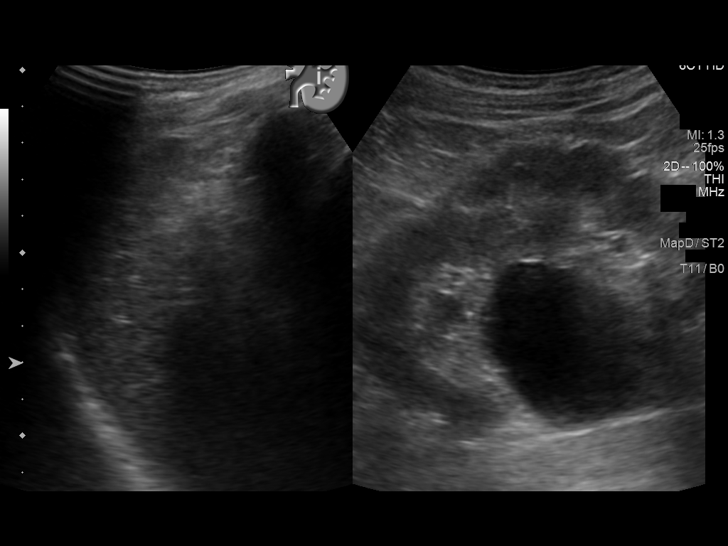
[im 15/40]
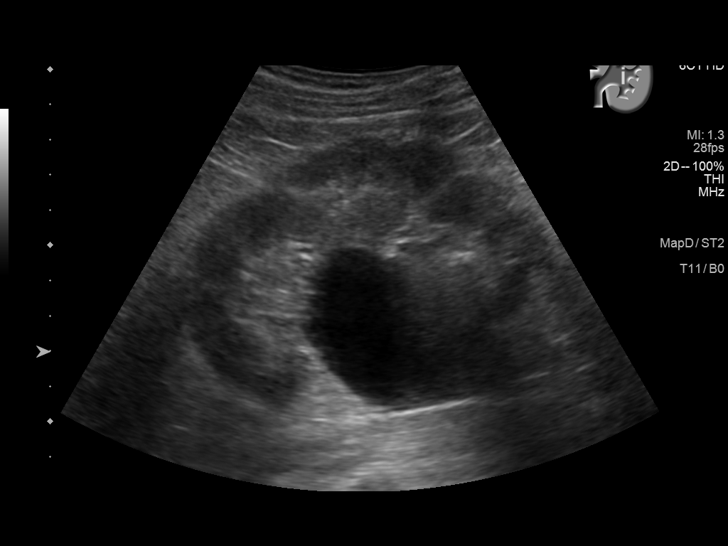
[im 18/40]
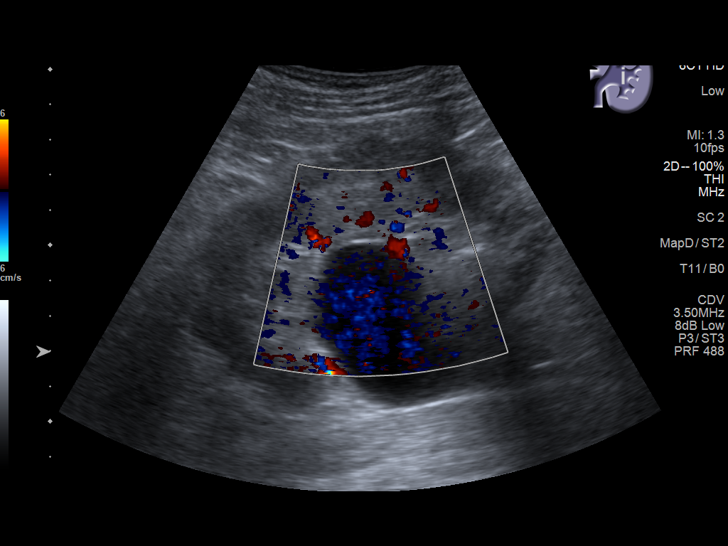
[im 22/40]
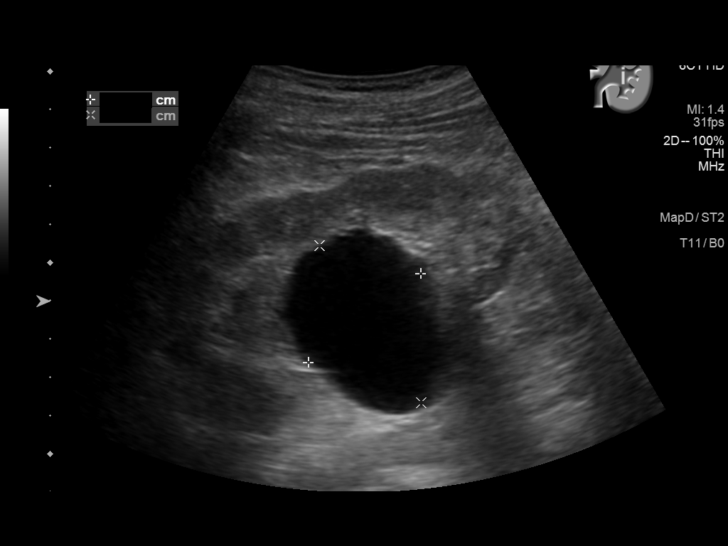
[im 25/40]
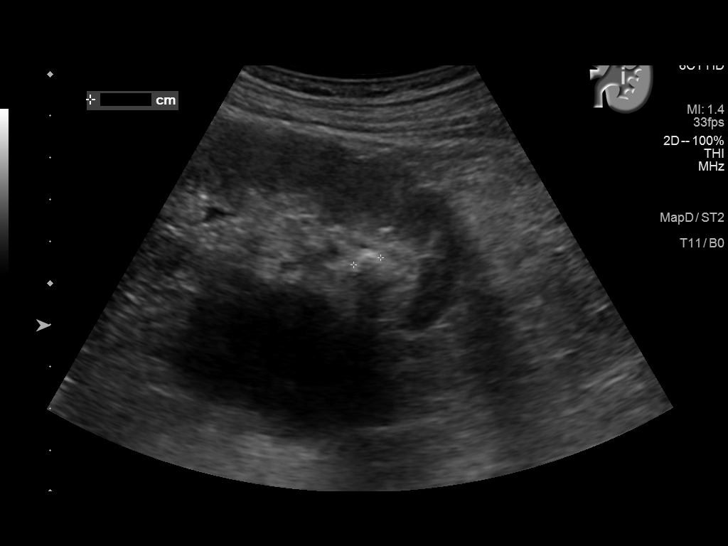
[im 27/40]
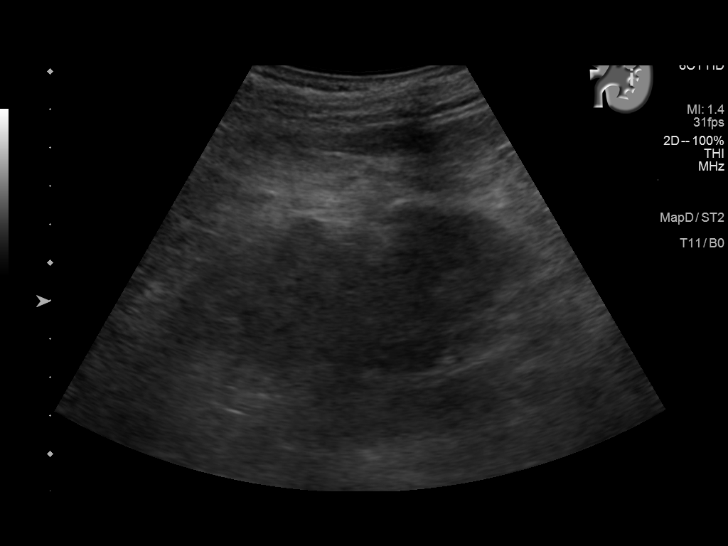
[im 30/40]
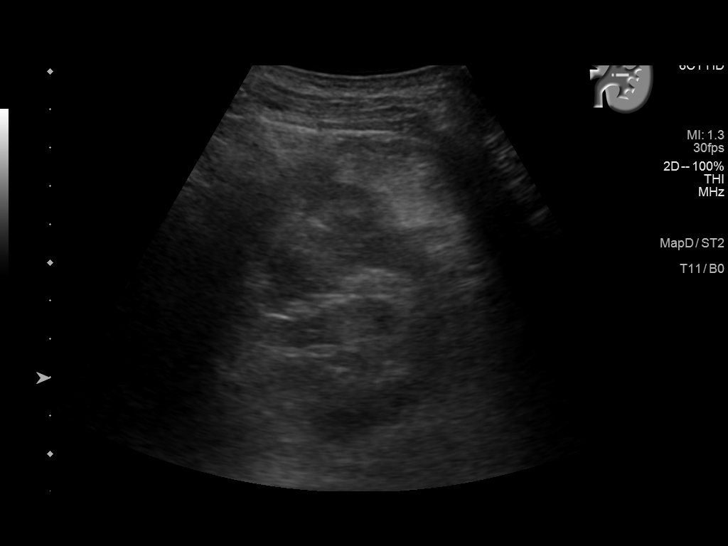
[im 33/40]
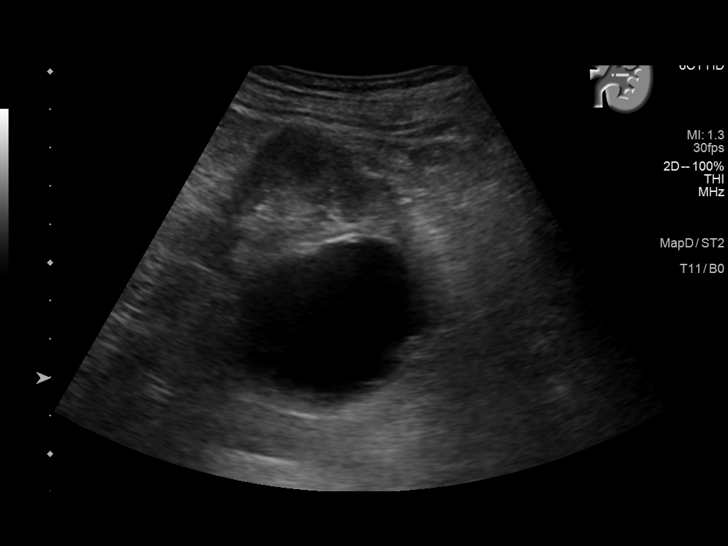
[im 36/40]
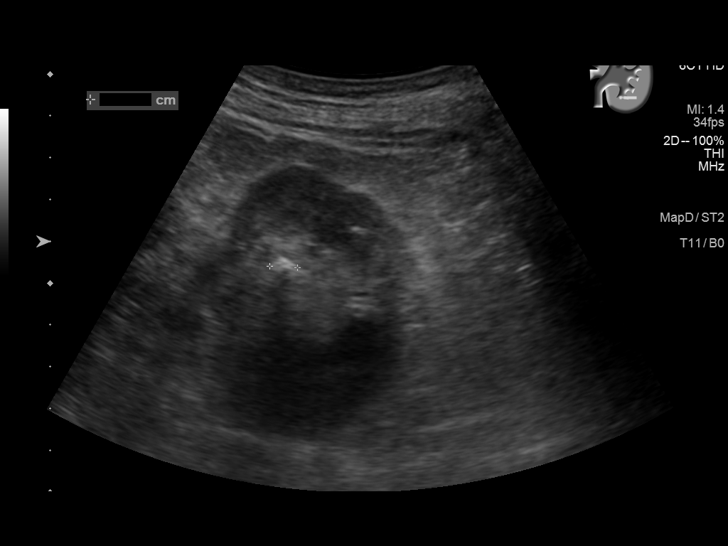
[im 40/40]
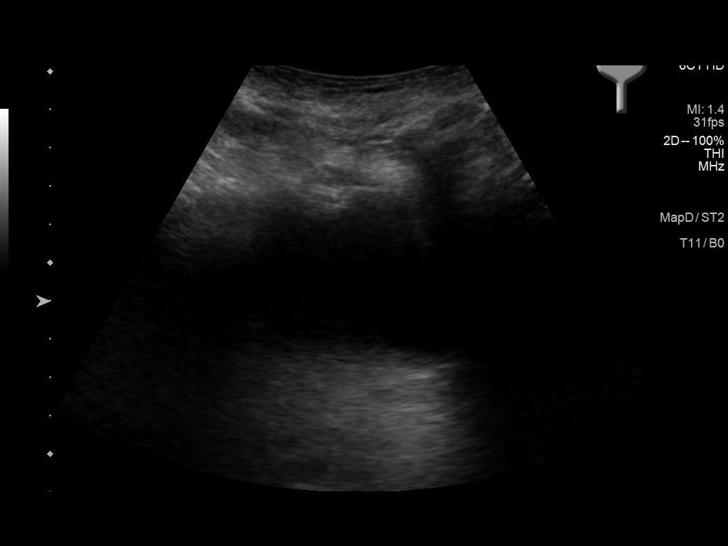

[14 of 25 positions shown; findings below may reference images not displayed]

FINDINGS: Right Kidney:

Renal measurements: 10.3 x 5.2 x 5.1 cm = volume: 141.9 mL.
Echogenicity within normal limits. No mass or hydronephrosis
visualized.

Left Kidney:

Renal measurements: 10.4 x 6.6 x 4.7 cm = volume: 169.3 mL. Contains
a nonobstructive 7 mm stone. A 4.9 cm midpole parapelvic cyst is
identified.

Bladder:

Limited evaluation due to lack of distention without obvious
abnormality.

Other:

None.
IMPRESSION: 1. Nonobstructive stone in the left kidney.
2. 4.9 cm parapelvic cyst on the left.
3. Poor evaluation of the bladder due to lack of distention. No
obvious bladder abnormality.
4. Normal right kidney.

## 2022-03-14 DIAGNOSIS — D649 Anemia, unspecified: Secondary | ICD-10-CM | POA: Diagnosis not present

## 2022-05-16 DIAGNOSIS — R1031 Right lower quadrant pain: Secondary | ICD-10-CM | POA: Diagnosis not present

## 2022-05-16 DIAGNOSIS — G8929 Other chronic pain: Secondary | ICD-10-CM | POA: Diagnosis not present

## 2022-05-16 DIAGNOSIS — K8020 Calculus of gallbladder without cholecystitis without obstruction: Secondary | ICD-10-CM | POA: Diagnosis not present

## 2022-05-16 DIAGNOSIS — D509 Iron deficiency anemia, unspecified: Secondary | ICD-10-CM | POA: Diagnosis not present

## 2022-06-17 DIAGNOSIS — I10 Essential (primary) hypertension: Secondary | ICD-10-CM | POA: Diagnosis not present

## 2022-06-17 DIAGNOSIS — I340 Nonrheumatic mitral (valve) insufficiency: Secondary | ICD-10-CM | POA: Diagnosis not present

## 2022-06-17 DIAGNOSIS — I350 Nonrheumatic aortic (valve) stenosis: Secondary | ICD-10-CM | POA: Diagnosis not present

## 2022-07-24 DEATH — deceased

## 2023-02-28 ENCOUNTER — Ambulatory Visit: Payer: Medicare HMO | Admitting: Urology
